# Patient Record
Sex: Female | Born: 1960 | Race: Black or African American | Hispanic: No | Marital: Single | Smoking: Never smoker
Health system: Southern US, Community
[De-identification: ages and names within clinical notes are randomized; demographics above are authoritative.]

## PROBLEM LIST (undated history)

## (undated) DIAGNOSIS — F419 Anxiety disorder, unspecified: Secondary | ICD-10-CM

## (undated) DIAGNOSIS — I10 Essential (primary) hypertension: Secondary | ICD-10-CM

## (undated) HISTORY — PX: ABDOMINAL HYSTERECTOMY: SHX81

---

## 2007-05-12 ENCOUNTER — Emergency Department (HOSPITAL_COMMUNITY): Admission: EM | Admit: 2007-05-12 | Discharge: 2007-05-12 | Payer: Self-pay | Admitting: Emergency Medicine

## 2011-05-15 LAB — CBC
HCT: 36.3
MCHC: 35.7
MCV: 86.3
Platelets: 407 — ABNORMAL HIGH
RDW: 14.3 — ABNORMAL HIGH

## 2011-05-15 LAB — DIFFERENTIAL
Basophils Relative: 0
Eosinophils Absolute: 0.1
Eosinophils Relative: 1
Lymphs Abs: 1.5
Neutrophils Relative %: 81 — ABNORMAL HIGH

## 2011-05-15 LAB — URINALYSIS, ROUTINE W REFLEX MICROSCOPIC
Bilirubin Urine: NEGATIVE
Glucose, UA: NEGATIVE
Hgb urine dipstick: NEGATIVE
Nitrite: NEGATIVE
Protein, ur: NEGATIVE

## 2011-05-15 LAB — COMPREHENSIVE METABOLIC PANEL
ALT: 27
Alkaline Phosphatase: 53
Calcium: 9.1
Chloride: 110
Creatinine, Ser: 0.71
Potassium: 3.8
Total Bilirubin: 0.4
Total Protein: 7.4

## 2011-05-15 LAB — PREGNANCY, URINE: Preg Test, Ur: NEGATIVE

## 2013-12-24 ENCOUNTER — Emergency Department (HOSPITAL_BASED_OUTPATIENT_CLINIC_OR_DEPARTMENT_OTHER): Payer: Managed Care, Other (non HMO)

## 2013-12-24 ENCOUNTER — Encounter (HOSPITAL_BASED_OUTPATIENT_CLINIC_OR_DEPARTMENT_OTHER): Payer: Self-pay | Admitting: Emergency Medicine

## 2013-12-24 ENCOUNTER — Emergency Department (HOSPITAL_BASED_OUTPATIENT_CLINIC_OR_DEPARTMENT_OTHER)
Admission: EM | Admit: 2013-12-24 | Discharge: 2013-12-24 | Disposition: A | Discharge status: Home / Self Care | Payer: Managed Care, Other (non HMO) | Attending: Emergency Medicine | Admitting: Emergency Medicine

## 2013-12-24 DIAGNOSIS — R000 Tachycardia, unspecified: Secondary | ICD-10-CM | POA: Insufficient documentation

## 2013-12-24 DIAGNOSIS — J3489 Other specified disorders of nose and nasal sinuses: Secondary | ICD-10-CM | POA: Insufficient documentation

## 2013-12-24 DIAGNOSIS — R079 Chest pain, unspecified: Secondary | ICD-10-CM

## 2013-12-24 DIAGNOSIS — F411 Generalized anxiety disorder: Secondary | ICD-10-CM | POA: Insufficient documentation

## 2013-12-24 DIAGNOSIS — R0981 Nasal congestion: Secondary | ICD-10-CM

## 2013-12-24 DIAGNOSIS — R072 Precordial pain: Secondary | ICD-10-CM | POA: Insufficient documentation

## 2013-12-24 DIAGNOSIS — F419 Anxiety disorder, unspecified: Secondary | ICD-10-CM

## 2013-12-24 HISTORY — DX: Anxiety disorder, unspecified: F41.9

## 2013-12-24 LAB — TROPONIN I

## 2013-12-24 LAB — BASIC METABOLIC PANEL
BUN: 9 mg/dL (ref 6–23)
CALCIUM: 10 mg/dL (ref 8.4–10.5)
CHLORIDE: 102 meq/L (ref 96–112)
CO2: 26 mEq/L (ref 19–32)
CREATININE: 0.6 mg/dL (ref 0.50–1.10)
GFR calc Af Amer: >90 mL/min (ref >90)
GFR calc non Af Amer: >90 mL/min (ref >90)
Glucose, Bld: 126 mg/dL — ABNORMAL HIGH (ref 70–99)
Potassium: 4.1 mEq/L (ref 3.7–5.3)
SODIUM: 141 meq/L (ref 137–147)

## 2013-12-24 LAB — CBC WITH DIFFERENTIAL/PLATELET
BASOS ABS: 0 10*3/uL (ref 0.0–0.1)
Basophils Relative: 0 % (ref 0–1)
EOS ABS: 0.8 10*3/uL — AB (ref 0.0–0.7)
Eosinophils Relative: 9 % — ABNORMAL HIGH (ref 0–5)
HCT: 36.5 % (ref 36.0–46.0)
Hemoglobin: 13.3 g/dL (ref 12.0–15.0)
Lymphocytes Relative: 33 % (ref 12–46)
Lymphs Abs: 3 10*3/uL (ref 0.7–4.0)
MCH: 30.2 pg (ref 26.0–34.0)
MCHC: 36.4 g/dL — ABNORMAL HIGH (ref 30.0–36.0)
MCV: 83 fL (ref 78.0–100.0)
MONO ABS: 0.4 10*3/uL (ref 0.1–1.0)
MONOS PCT: 5 % (ref 3–12)
NEUTROS PCT: 53 % (ref 43–77)
Neutro Abs: 4.8 10*3/uL (ref 1.7–7.7)
PLATELETS: 403 10*3/uL — AB (ref 150–400)
RBC: 4.4 MIL/uL (ref 3.87–5.11)
RDW: 14.5 % (ref 11.5–15.5)
WBC: 9.1 10*3/uL (ref 4.0–10.5)

## 2013-12-24 MED ORDER — LORAZEPAM 1 MG PO TABS
1.0000 mg | ORAL_TABLET | Freq: Once | ORAL | Status: DC
  Filled 2013-12-24: qty 1

## 2013-12-24 NOTE — ED Notes (Signed)
Sinus congestion/pressure this week.  Also c/o intermittent palpitations, chest pain x two days.  Pain was mid-sternal with radiation laterally to either side.  Denies SHOB, nausea.  Seen at [facility], referred here for cardiac workup.  History of anxiety with medicinal management.

## 2013-12-24 NOTE — ED Notes (Signed)
No Rx given- pt denies pain- states she feels better

## 2013-12-24 NOTE — ED Notes (Signed)
IV d/c, cath intact, pt tolerated well 

## 2013-12-24 NOTE — ED Notes (Signed)
MD at bedside. 

## 2013-12-24 NOTE — Discharge Instructions (Signed)
Chest Pain (Nonspecific) °It is often hard to give a specific diagnosis for the cause of chest pain. There is always a chance that your pain could be related to something serious, such as a heart attack or a blood clot in the lungs. You need to follow up with your caregiver for further evaluation. °CAUSES  °· Heartburn. °· Pneumonia or bronchitis. °· Anxiety or stress. °· Inflammation around your heart (pericarditis) or lung (pleuritis or pleurisy). °· A blood clot in the lung. °· A collapsed lung (pneumothorax). It can develop suddenly on its own (spontaneous pneumothorax) or from injury (trauma) to the chest. °· Shingles infection (herpes zoster virus). °The chest wall is composed of bones, muscles, and cartilage. Any of these can be the source of the pain. °· The bones can be bruised by injury. °· The muscles or cartilage can be strained by coughing or overwork. °· The cartilage can be affected by inflammation and become sore (costochondritis). °DIAGNOSIS  °Lab tests or other studies, such as X-rays, electrocardiography, stress testing, or cardiac imaging, may be needed to find the cause of your pain.  °TREATMENT  °· Treatment depends on what may be causing your chest pain. Treatment may include: °· Acid blockers for heartburn. °· Anti-inflammatory medicine. °· Pain medicine for inflammatory conditions. °· Antibiotics if an infection is present. °· You may be advised to change lifestyle habits. This includes stopping smoking and avoiding alcohol, caffeine, and chocolate. °· You may be advised to keep your head raised (elevated) when sleeping. This reduces the chance of acid going backward from your stomach into your esophagus. °· Most of the time, nonspecific chest pain will improve within 2 to 3 days with rest and mild pain medicine. °HOME CARE INSTRUCTIONS  °· If antibiotics were prescribed, take your antibiotics as directed. Finish them even if you start to feel better. °· For the next few days, avoid physical  activities that bring on chest pain. Continue physical activities as directed. °· Do not smoke. °· Avoid drinking alcohol. °· Only take over-the-counter or prescription medicine for pain, discomfort, or fever as directed by your caregiver. °· Follow your caregiver's suggestions for further testing if your chest pain does not go away. °· Keep any follow-up appointments you made. If you do not go to an appointment, you could develop lasting (chronic) problems with pain. If there is any problem keeping an appointment, you must call to reschedule. °SEEK MEDICAL CARE IF:  °· You think you are having problems from the medicine you are taking. Read your medicine instructions carefully. °· Your chest pain does not go away, even after treatment. °· You develop a rash with blisters on your chest. °SEEK IMMEDIATE MEDICAL CARE IF:  °· You have increased chest pain or pain that spreads to your arm, neck, jaw, back, or abdomen. °· You develop shortness of breath, an increasing cough, or you are coughing up blood. °· You have severe back or abdominal pain, feel nauseous, or vomit. °· You develop severe weakness, fainting, or chills. °· You have a fever. °THIS IS AN EMERGENCY. Do not wait to see if the pain will go away. Get medical help at once. Call your local emergency services (911 in [place] Do not drive yourself to the hospital. °MAKE SURE YOU:  °· Understand these instructions. °· Will watch your condition. °· Will get help right away if you are not doing well or get worse. °Document Released: 04/30/2005 Document Revised: 10/13/2011 Document Reviewed: 02/24/2008 °Exit[employer]® Patient Information ©2014 [employer]   [employer]. ° °

## 2013-12-24 NOTE — ED Provider Notes (Addendum)
CSN: [id number]     Arrival date & time 12/24/13  1634 History   This chart was scribed for Rolan Bucco, MD by Nicholos Johns, ED scribe. This patient was seen in room MH01/MH01 and the patient's care was started at 5:11 PM.     Chief Complaint  Patient presents with  . Palpitations     The history is provided by the patient. No language interpreter was used.  HPI Comments: Sheryl Ross is a 53 y.o. female who presents to the Emergency Department complaining of intermittent palpitation and chest pain located mid sternal radiating to either side; onset 2 days ago. Also reports some lightheadedness and pressure along the occipital region. Palpitations and chest pain will last usually last a couple of seconds. Nothing in particular onsets pain or palpitations. States she felt like her left leg was a little numb earlier; has since resolved. Has a hx of anxiety. States heart will beat really fast during episodes. States current symptoms are like prior anxiety symptoms the only difference is the lightheadedness this time around. Does not report feeling anxious today but has been feeling "jittery" over the last couple of days. Pt states she has had congestion and sinus pressure for the past week. Was given a z-pack 2 weeks ago to treat and just finished taking that. Says she was feeling lightheaded earlier and went to [facility] and advised to report here for cardiac workup.  Last cardiac workup was in January 2015. No prior hx of heart trouble. No family hx of heart problems. Does not smoke. Not on birth control pills. Denies SOB, nausea, or vomiting.   Past Medical History  Diagnosis Date  . Anxiety    Past Surgical History  Procedure Laterality Date  . Abdominal hysterectomy     No family history on file. History  Substance Use Topics  . Smoking status: Never Smoker   . Smokeless tobacco: Not on file  . Alcohol Use: No   OB History   Grav Para Term Preterm Abortions TAB  SAB Ect Mult Living                 Review of Systems  Constitutional: Negative for fever, chills, diaphoresis and fatigue.  HENT: Positive for congestion and sinus pressure. Negative for rhinorrhea and sneezing.   Eyes: Negative.   Respiratory: Negative for cough, chest tightness and shortness of breath.   Cardiovascular: Positive for palpitations. Negative for chest pain and leg swelling.  Gastrointestinal: Negative for nausea, vomiting, abdominal pain, diarrhea and blood in stool.  Genitourinary: Negative for frequency, hematuria, flank pain and difficulty urinating.  Musculoskeletal: Negative for arthralgias and back pain.  Skin: Negative for rash.  Neurological: Positive for light-headedness. Negative for dizziness, speech difficulty, weakness, numbness and headaches.    Allergies  Review of patient's allergies indicates no known allergies.  Home Medications   Prior to Admission medications   Medication Sig Start Date End Date Taking? Authorizing Provider  LORazepam (ATIVAN) 0.5 MG tablet Take 0.5 mg by mouth every 6 (six) hours as needed for anxiety.   Yes Historical Provider, MD   Triage Vitals: BP 178/101  Pulse 112  Temp(Src) 98.6 F (37 C) (Oral)  Resp 20  Ht 5\' 1"  (1.549 m)  Wt 147 lb (66.679 kg)  BMI 27.79 kg/m2  SpO2 100% Physical Exam  Nursing note and vitals reviewed. Constitutional: She is oriented to person, place, and time. She appears well-developed and well-nourished.  HENT:  Head: Normocephalic and  atraumatic.  Eyes: Pupils are equal, round, and reactive to light.  Neck: Normal range of motion. Neck supple.  Cardiovascular: Regular rhythm and normal heart sounds.  Tachycardia present.   Pulmonary/Chest: Effort normal and breath sounds normal. No respiratory distress. She has no wheezes. She has no rales. She exhibits no tenderness.  Abdominal: Soft. Bowel sounds are normal. There is no tenderness. There is no rebound and no guarding.  Musculoskeletal:  Normal range of motion. She exhibits no edema.  No calf tenderness.  Lymphadenopathy:    She has no cervical adenopathy.  Neurological: She is alert and oriented to person, place, and time.  Skin: Skin is warm and dry. No rash noted.  Psychiatric: She has a normal mood and affect.    ED Course  Procedures (including critical care time) DIAGNOSTIC STUDIES: Oxygen Saturation is 100% on room air, normal by my interpretation.    COORDINATION OF CARE: At 5:19 PM: Discussed treatment plan with patient which includes chest x-ray. Patient agrees.   Results for orders placed during the hospital encounter of 12/24/13  CBC WITH DIFFERENTIAL      Result Value Ref Range   WBC 9.1  4.0 - 10.5 K/uL   RBC 4.40  3.87 - 5.11 MIL/uL   Hemoglobin 13.3  12.0 - 15.0 g/dL   HCT 12.8  78.6 - 76.7 %   MCV 83.0  78.0 - 100.0 fL   MCH 30.2  26.0 - 34.0 pg   MCHC 36.4 (*) 30.0 - 36.0 g/dL   RDW 20.9  47.0 - 96.2 %   Platelets 403 (*) 150 - 400 K/uL   Neutrophils Relative % 53  43 - 77 %   Neutro Abs 4.8  1.7 - 7.7 K/uL   Lymphocytes Relative 33  12 - 46 %   Lymphs Abs 3.0  0.7 - 4.0 K/uL   Monocytes Relative 5  3 - 12 %   Monocytes Absolute 0.4  0.1 - 1.0 K/uL   Eosinophils Relative 9 (*) 0 - 5 %   Eosinophils Absolute 0.8 (*) 0.0 - 0.7 K/uL   Basophils Relative 0  0 - 1 %   Basophils Absolute 0.0  0.0 - 0.1 K/uL  BASIC METABOLIC PANEL      Result Value Ref Range   Sodium 141  137 - 147 mEq/L   Potassium 4.1  3.7 - 5.3 mEq/L   Chloride 102  96 - 112 mEq/L   CO2 26  19 - 32 mEq/L   Glucose, Bld 126 (*) 70 - 99 mg/dL   BUN 9  6 - 23 mg/dL   Creatinine, Ser 8.36  0.50 - 1.10 mg/dL   Calcium 62.9  8.4 - 47.6 mg/dL   GFR calc non Af Amer >90  >90 mL/min   GFR calc Af Amer >90  >90 mL/min  TROPONIN I      Result Value Ref Range   Troponin I <0.30  <0.30 ng/mL   Dg Chest 2 View  12/24/2013   CLINICAL DATA:  Chest pain.  EXAM: CHEST  2 VIEW  COMPARISON:  None.  FINDINGS: There is borderline  cardiomegaly. Pulmonary vascularity is normal and the lungs are clear. No effusions. No osseous abnormality.  IMPRESSION: Borderline cardiomegaly.   Electronically Signed   By: Geanie Cooley M.D.   On: 12/24/2013 17:30    Date: 12/24/2013  Rate: 98  Rhythm: normal sinus rhythm  QRS Axis: normal  Intervals: QT prolonged  ST/T Wave abnormalities: normal  Conduction Disutrbances:none  Narrative Interpretation:   Old EKG Reviewed: none available   MDM   Final diagnoses:  Chest pain  Anxiety  Sinus congestion   Patient presents with heart palpitations and associated chest pain that lasts just seconds at a time. It's nonexertional. She's had similar symptoms in the past with her anxiety attacks. She has Ativan to use at home for anxiety. Vomiting she was concerned about today as she was having some episodes of lightheadedness. It's worse when she bends over or turns her head. She does lightheadedness is not associated with the chest pain or palpitations. She's had a week history of sinus congestion. I feel that that lightheadedness is more associated with the sinus congestion versus acute coronary syndrome. She has no suggestions of pulmonary embolus. She has no risk factors for PE. She has a heart score of 1. She has no ischemic changes on EKG. Her troponin is negative. I feel that she can be discharged home. She's going to take her Ativan at home. I encouraged her followup with her [place]. Advised to return here if she has any worsening symptoms over the weekend.  I personally performed the services described in this documentation, which was scribed in my presence.  The recorded information has been reviewed and considered.      Rolan BuccoMelanie Deaundra Kutzer, MD 12/24/13 Ebony [id number]  Rolan BuccoMelanie Eoghan Belcher, MD 12/24/13 2036

## 2015-10-01 IMAGING — CR DG CHEST 2V
2 series · 2 of 2 positions shown · non-contrast
Comparison: None.

CLINICAL DATA: Chest pain.

EXAM:
CHEST  2 VIEW

[w chest pa]
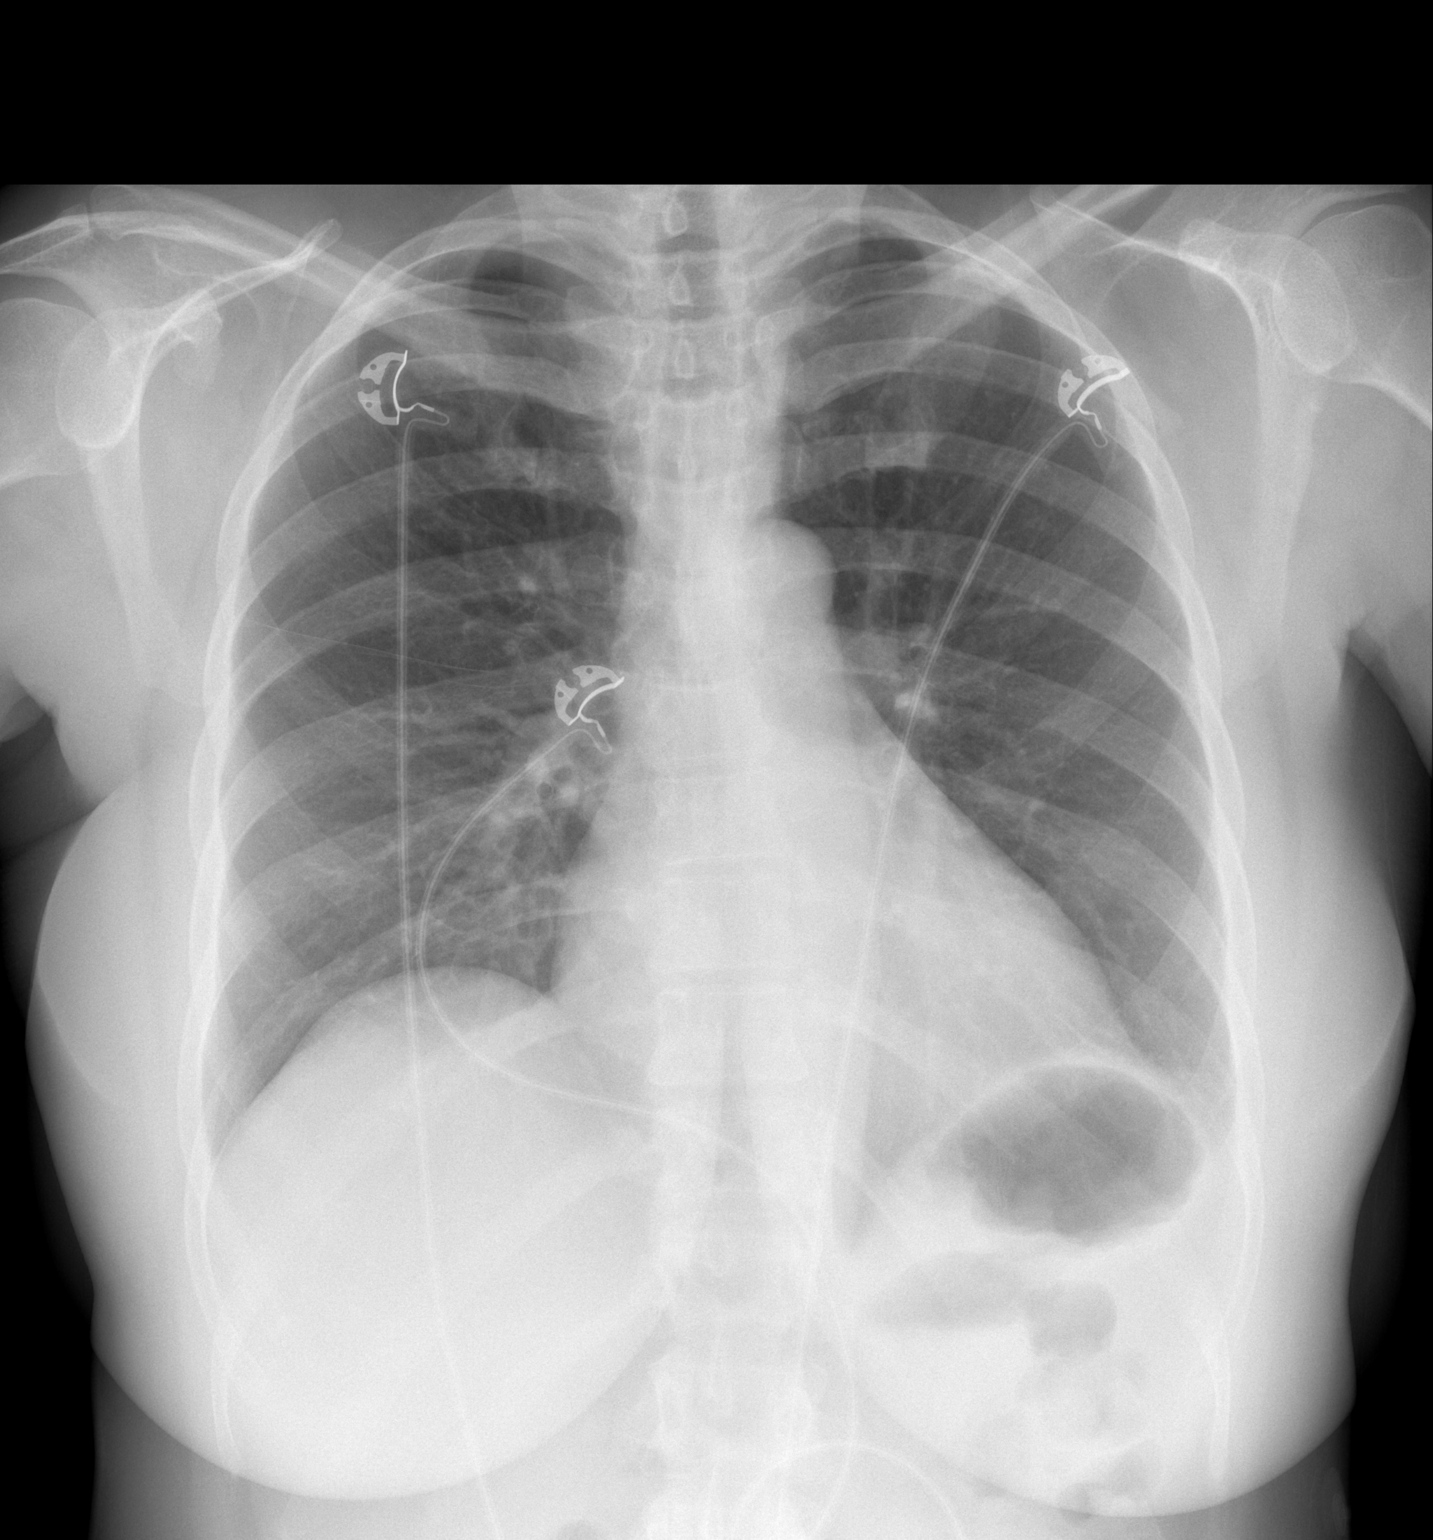

[w chest lat]
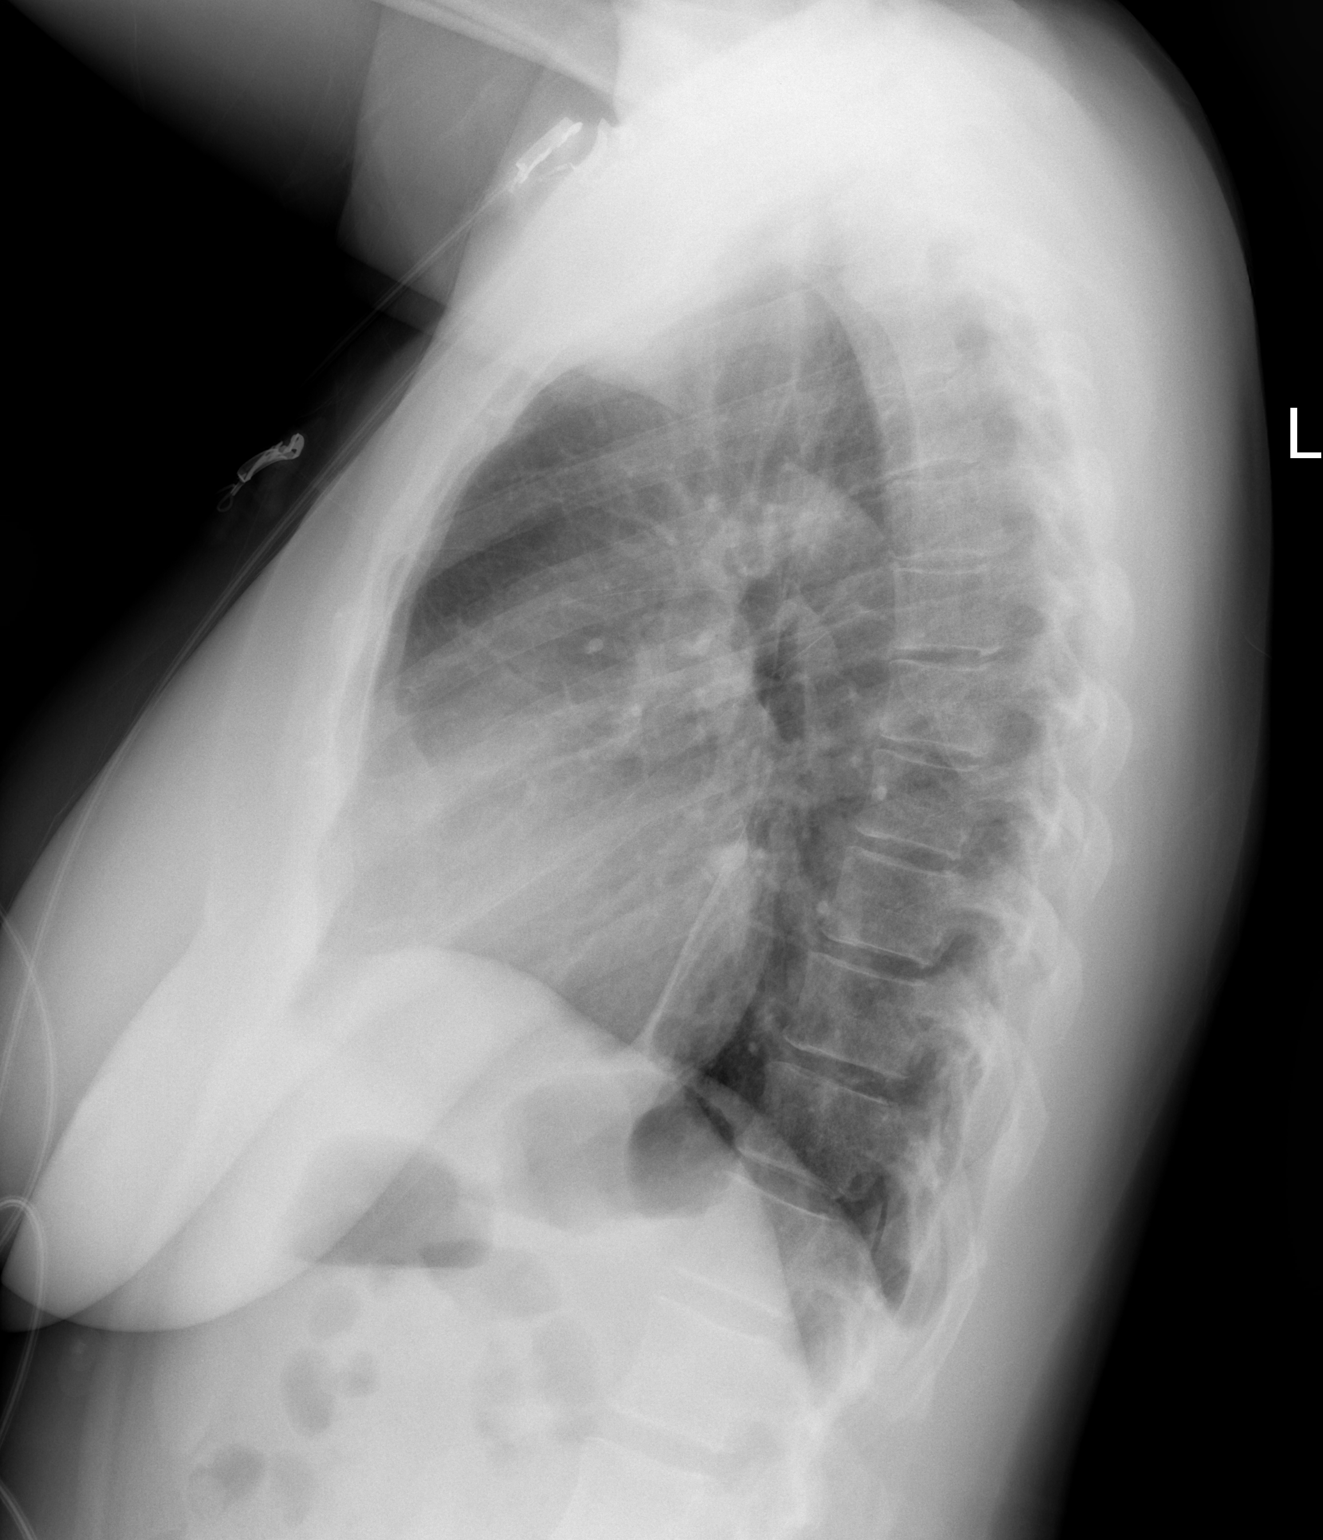

[2 of 2 positions shown; findings below may reference images not displayed]

FINDINGS: There is borderline cardiomegaly. Pulmonary vascularity is normal
and the lungs are clear. No effusions. No osseous abnormality.
IMPRESSION: Borderline cardiomegaly.

## 2022-07-18 DIAGNOSIS — J302 Other seasonal allergic rhinitis: Secondary | ICD-10-CM | POA: Insufficient documentation

## 2023-01-05 ENCOUNTER — Emergency Department (HOSPITAL_BASED_OUTPATIENT_CLINIC_OR_DEPARTMENT_OTHER)
Admission: EM | Admit: 2023-01-05 | Discharge: 2023-01-05 | Disposition: A | Discharge status: Home / Self Care | Payer: No Typology Code available for payment source | Attending: Emergency Medicine | Admitting: Emergency Medicine

## 2023-01-05 ENCOUNTER — Other Ambulatory Visit: Payer: Self-pay

## 2023-01-05 ENCOUNTER — Encounter (HOSPITAL_BASED_OUTPATIENT_CLINIC_OR_DEPARTMENT_OTHER): Payer: Self-pay | Admitting: Emergency Medicine

## 2023-01-05 DIAGNOSIS — I10 Essential (primary) hypertension: Secondary | ICD-10-CM | POA: Diagnosis not present

## 2023-01-05 DIAGNOSIS — E876 Hypokalemia: Secondary | ICD-10-CM

## 2023-01-05 DIAGNOSIS — R112 Nausea with vomiting, unspecified: Secondary | ICD-10-CM

## 2023-01-05 DIAGNOSIS — Z79899 Other long term (current) drug therapy: Secondary | ICD-10-CM | POA: Insufficient documentation

## 2023-01-05 DIAGNOSIS — K219 Gastro-esophageal reflux disease without esophagitis: Secondary | ICD-10-CM | POA: Insufficient documentation

## 2023-01-05 HISTORY — DX: Essential (primary) hypertension: I10

## 2023-01-05 LAB — CBC WITH DIFFERENTIAL/PLATELET
Abs Immature Granulocytes: 0.03 10*3/uL (ref 0.00–0.07)
Basophils Absolute: 0.1 10*3/uL (ref 0.0–0.1)
Basophils Relative: 1 %
Eosinophils Absolute: 0.2 10*3/uL (ref 0.0–0.5)
Eosinophils Relative: 2 %
HCT: 36 % (ref 36.0–46.0)
Hemoglobin: 12.7 g/dL (ref 12.0–15.0)
Immature Granulocytes: 0 %
Lymphocytes Relative: 45 %
Lymphs Abs: 4.3 10*3/uL — ABNORMAL HIGH (ref 0.7–4.0)
MCH: 29.6 pg (ref 26.0–34.0)
MCHC: 35.3 g/dL (ref 30.0–36.0)
MCV: 83.9 fL (ref 80.0–100.0)
Monocytes Absolute: 0.6 10*3/uL (ref 0.1–1.0)
Monocytes Relative: 7 %
Neutro Abs: 4.3 10*3/uL (ref 1.7–7.7)
Neutrophils Relative %: 45 %
Platelets: 433 10*3/uL — ABNORMAL HIGH (ref 150–400)
RBC: 4.29 MIL/uL (ref 3.87–5.11)
RDW: 14.7 % (ref 11.5–15.5)
WBC: 9.6 10*3/uL (ref 4.0–10.5)
nRBC: 0 % (ref 0.0–0.2)

## 2023-01-05 LAB — COMPREHENSIVE METABOLIC PANEL
ALT: 26 U/L (ref 0–44)
AST: 27 U/L (ref 15–41)
Albumin: 3.6 g/dL (ref 3.5–5.0)
Alkaline Phosphatase: 74 U/L (ref 38–126)
Anion gap: 9 (ref 5–15)
BUN: 14 mg/dL (ref 6–20)
CO2: 28 mmol/L (ref 22–32)
Calcium: 8.8 mg/dL — ABNORMAL LOW (ref 8.9–10.3)
Chloride: 102 mmol/L (ref 98–111)
Creatinine, Ser: 0.77 mg/dL (ref 0.44–1.00)
GFR, Estimated: >60 mL/min (ref >60)
Glucose, Bld: 131 mg/dL — ABNORMAL HIGH (ref 70–99)
Potassium: 3.2 mmol/L — ABNORMAL LOW (ref 3.5–5.1)
Sodium: 139 mmol/L (ref 135–145)
Total Bilirubin: 0.3 mg/dL (ref 0.3–1.2)
Total Protein: 8.3 g/dL — ABNORMAL HIGH (ref 6.5–8.1)

## 2023-01-05 MED ORDER — ONDANSETRON HCL 4 MG/2ML IJ SOLN
4.0000 mg | Freq: Once | INTRAMUSCULAR | Status: AC
  Administered 2023-01-05: 4 mg via INTRAVENOUS
  Filled 2023-01-05: qty 2

## 2023-01-05 MED ORDER — SODIUM CHLORIDE 0.9 % IV BOLUS
1000.0000 mL | Freq: Once | INTRAVENOUS | Status: AC
  Administered 2023-01-05: 1000 mL via INTRAVENOUS

## 2023-01-05 MED ORDER — METOCLOPRAMIDE HCL 5 MG/ML IJ SOLN: 10.0000 mg | Freq: Once | INTRAMUSCULAR | Status: DC | PRN

## 2023-01-05 MED ORDER — ONDANSETRON 8 MG PO TBDP: 8.0000 mg | ORAL_TABLET | Freq: Three times a day (TID) | ORAL | 0 refills | Status: AC | PRN

## 2023-01-05 MED ORDER — POTASSIUM CHLORIDE CRYS ER 20 MEQ PO TBCR
20.0000 meq | EXTENDED_RELEASE_TABLET | Freq: Once | ORAL | Status: AC
  Administered 2023-01-05: 20 meq via ORAL
  Filled 2023-01-05: qty 1

## 2023-01-05 NOTE — ED Provider Notes (Addendum)
[facility] Provider Note: Sheryl Dell, MD, FACEP  CSN: [id number] MRN: [id number] ARRIVAL: 01/05/23 at 0107 ROOM: MH05/MH05   CHIEF COMPLAINT  Vomiting   HISTORY OF PRESENT ILLNESS  01/05/23 1:20 AM ROCSI Sheryl Ross is a 62 y.o. female who ate dinner about 8 PM.  About 11:20 PM she started feeling nauseated and this was followed by multiple episodes of vomiting.  She states the vomiting was nearly back to back.  She felt the urge to have a bowel movement but has not had a bowel movement.  She is having some intermittent abdominal cramping which, when present, she rates as a 7 out of 10.  Her boyfriend, who had dinner with her, did not get sick.   Past Medical History:  Diagnosis Date   Anxiety    Hypertension     Past Surgical History:  Procedure Laterality Date   ABDOMINAL HYSTERECTOMY      History reviewed. No pertinent family history.  Social History   Tobacco Use   Smoking status: Never  Substance Use Topics   Alcohol use: No   Drug use: No    Prior to Admission medications   Medication Sig Start Date End Date Taking? Authorizing Provider  atenolol-chlorthalidone (TENORETIC) 50-25 MG tablet Take 1 tablet by mouth daily. 02/07/16  Yes [provider]  ondansetron (ZOFRAN-ODT) 8 MG disintegrating tablet Take 1 tablet (8 mg total) by mouth every 8 (eight) hours as needed for nausea or vomiting. 01/05/23  Yes Juanmanuel Marohl, MD  LORazepam (ATIVAN) 0.5 MG tablet Take 0.5 mg by mouth every 6 (six) hours as needed for anxiety.    [provider]    Allergies Patient has no known allergies.   REVIEW OF SYSTEMS  Negative except as noted here or in the History of Present Illness.   PHYSICAL EXAMINATION  Initial Vital Signs Blood pressure 126/72, pulse 85, temperature 98.5 F (36.9 C), temperature source Oral, resp. rate 18, height 5\' 1"  (1.549 m), weight 77.1 kg, SpO2 100 %.  Examination General: Well-developed, well-nourished female  in no acute distress; appearance consistent with age of record HENT: normocephalic; atraumatic Eyes: Normal appearance Neck: supple Heart: regular rate and rhythm Lungs: clear to auscultation bilaterally Abdomen: soft; nondistended; nontender; bowel sounds present Extremities: No deformity; full range of motion; pulses normal Neurologic: Awake, alert and oriented; motor function intact in all extremities and symmetric; no facial droop Skin: Warm and dry Psychiatric: Normal mood and affect   RESULTS  Summary of this visit's results, reviewed and interpreted by myself:   EKG Interpretation  Date/Time:    Ventricular Rate:    PR Interval:    QRS Duration:   QT Interval:    QTC Calculation:   R Axis:     Text Interpretation:         Laboratory Studies: Results for orders placed or performed during the hospital encounter of 01/05/23 (from the past 24 hour(s))  CBC with Differential     Status: Abnormal   Collection Time: 01/05/23  1:32 AM  Result Value Ref Range   WBC 9.6 4.0 - 10.5 K/uL   RBC 4.29 3.87 - 5.11 MIL/uL   Hemoglobin 12.7 12.0 - 15.0 g/dL   HCT 95.2 84.1 - 32.4 %   MCV 83.9 80.0 - 100.0 fL   MCH 29.6 26.0 - 34.0 pg   MCHC 35.3 30.0 - 36.0 g/dL   RDW 40.1 02.7 - 25.3 %   Platelets 433 (H) 150 - 400  K/uL   nRBC 0.0 0.0 - 0.2 %   Neutrophils Relative % 45 %   Neutro Abs 4.3 1.7 - 7.7 K/uL   Lymphocytes Relative 45 %   Lymphs Abs 4.3 (H) 0.7 - 4.0 K/uL   Monocytes Relative 7 %   Monocytes Absolute 0.6 0.1 - 1.0 K/uL   Eosinophils Relative 2 %   Eosinophils Absolute 0.2 0.0 - 0.5 K/uL   Basophils Relative 1 %   Basophils Absolute 0.1 0.0 - 0.1 K/uL   Immature Granulocytes 0 %   Abs Immature Granulocytes 0.03 0.00 - 0.07 K/uL  Comprehensive metabolic panel     Status: Abnormal   Collection Time: 01/05/23  1:32 AM  Result Value Ref Range   Sodium 139 135 - 145 mmol/L   Potassium 3.2 (L) 3.5 - 5.1 mmol/L   Chloride 102 98 - 111 mmol/L   CO2 28 22 - 32  mmol/L   Glucose, Bld 131 (H) 70 - 99 mg/dL   BUN 14 6 - 20 mg/dL   Creatinine, Ser 1.61 0.44 - 1.00 mg/dL   Calcium 8.8 (L) 8.9 - 10.3 mg/dL   Total Protein 8.3 (H) 6.5 - 8.1 g/dL   Albumin 3.6 3.5 - 5.0 g/dL   AST 27 15 - 41 U/L   ALT 26 0 - 44 U/L   Alkaline Phosphatase 74 38 - 126 U/L   Total Bilirubin 0.3 0.3 - 1.2 mg/dL   GFR, Estimated >09 >60 mL/min   Anion gap 9 5 - 15   Imaging Studies: No results found.  ED COURSE and MDM  Nursing notes, initial and subsequent vitals signs, including pulse oximetry, reviewed and interpreted by myself.  Vitals:   01/05/23 0115 01/05/23 0117  BP:  126/72  Pulse:  85  Resp:  18  Temp:  98.5 F (36.9 C)  TempSrc:  Oral  SpO2:  100%  Weight: 77.1 kg   Height: 5\' 1"  (1.549 m)    Medications  metoCLOPramide (REGLAN) injection 10 mg (has no administration in time range)  sodium chloride 0.9 % bolus 1,000 mL (0 mLs Intravenous Stopped 01/05/23 0216)  ondansetron (ZOFRAN) injection 4 mg (4 mg Intravenous Given 01/05/23 0135)  potassium chloride SA (KLOR-CON M) CR tablet 20 mEq (20 mEq Oral Given 01/05/23 0234)   2:28 AM Nausea relieved after IV fluids and Zofran.  Patient able to drink fluids without vomiting.  The symptoms are consistent with a viral gastroenteritis although she has not yet had diarrhea.  She was advised that diarrhea may develop.  There is no tenderness or severe pain to make me concerned for an acute surgical abdomen.   PROCEDURES  Procedures   ED DIAGNOSES     ICD-10-CM   1. Nausea and vomiting in adult  R11.2     2. Hypokalemia due to excessive gastrointestinal loss of potassium  E87.6          Ermal Brzozowski, MD 01/05/23 0230    Paula Libra, MD 01/05/23 [phone]

## 2023-01-05 NOTE — ED Triage Notes (Signed)
Pt reports 3-4 episodes of vomiting a few hours after eating dinner tonight. Non-bloody emesis. Denies any other symptoms, states she thought she was going to have diarrhea but didn't. Ambulatory on arrival. No active vomiting during triage.

## 2023-01-05 NOTE — ED Notes (Signed)
Pt given ice chips and ginger ale for P.O. challenge attempt
